# Patient Record
Sex: Male | Born: 1985
Health system: Southern US, Community
[De-identification: ages and names within clinical notes are randomized; demographics above are authoritative.]

## PROBLEM LIST (undated history)

## (undated) DIAGNOSIS — E119 Type 2 diabetes mellitus without complications: Secondary | ICD-10-CM

## (undated) HISTORY — PX: ORTHOPEDIC SURGERY: SHX850

---
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
## 2012-07-28 ENCOUNTER — Emergency Department: Payer: Self-pay | Admitting: Emergency Medicine

## 2012-07-28 LAB — BETA-HYDROXYBUTYRIC ACID: Beta-Hydroxybutyrate: 17.3 mg/dL — ABNORMAL HIGH (ref 0.2–2.8)

## 2012-07-28 LAB — CBC
MCH: 28.3 pg (ref 26.0–34.0)
MCV: 82 fL (ref 80–100)
Platelet: 327 10*3/uL (ref 150–440)

## 2012-07-28 LAB — COMPREHENSIVE METABOLIC PANEL
Alkaline Phosphatase: 135 U/L (ref 50–136)
Anion Gap: 13 (ref 7–16)
Co2: 24 mmol/L (ref 21–32)
EGFR (African American): 32 — ABNORMAL LOW
Glucose: 536 mg/dL (ref 65–99)
Potassium: 6 mmol/L — ABNORMAL HIGH (ref 3.5–5.1)
Sodium: 125 mmol/L — ABNORMAL LOW (ref 136–145)

## 2012-07-28 LAB — CK: CK, Total: 2370 U/L — ABNORMAL HIGH (ref 35–232)

## 2012-07-29 ENCOUNTER — Encounter (HOSPITAL_COMMUNITY): Payer: Self-pay | Admitting: Cardiology

## 2012-07-29 ENCOUNTER — Observation Stay (HOSPITAL_COMMUNITY)
Admission: EM | Admit: 2012-07-29 | Discharge: 2012-07-29 | Disposition: A | Discharge status: Home / Self Care | Payer: 59 | Source: Other Acute Inpatient Hospital | Attending: Internal Medicine | Admitting: Internal Medicine

## 2012-07-29 DIAGNOSIS — Z9641 Presence of insulin pump (external) (internal): Secondary | ICD-10-CM | POA: Insufficient documentation

## 2012-07-29 DIAGNOSIS — E1310 Other specified diabetes mellitus with ketoacidosis without coma: Secondary | ICD-10-CM

## 2012-07-29 DIAGNOSIS — E1110 Type 2 diabetes mellitus with ketoacidosis without coma: Secondary | ICD-10-CM

## 2012-07-29 DIAGNOSIS — Z794 Long term (current) use of insulin: Secondary | ICD-10-CM | POA: Insufficient documentation

## 2012-07-29 DIAGNOSIS — E1010 Type 1 diabetes mellitus with ketoacidosis without coma: Principal | ICD-10-CM | POA: Insufficient documentation

## 2012-07-29 HISTORY — DX: Type 2 diabetes mellitus without complications: E11.9

## 2012-07-29 LAB — BASIC METABOLIC PANEL
BUN: 37 mg/dL — ABNORMAL HIGH (ref 7–18)
BUN: 34 mg/dL — ABNORMAL HIGH (ref 6–23)
BUN: 31 mg/dL — ABNORMAL HIGH (ref 6–23)
CO2: 27 mEq/L (ref 19–32)
CO2: 23 mEq/L (ref 19–32)
Calcium, Total: 8.1 mg/dL — ABNORMAL LOW (ref 8.5–10.1)
Calcium: 8.6 mg/dL (ref 8.4–10.5)
Chloride: 105 mmol/L (ref 98–107)
Chloride: 103 mEq/L (ref 96–112)
Chloride: 98 mEq/L (ref 96–112)
Co2: 28 mmol/L (ref 21–32)
Creatinine, Ser: 1.46 mg/dL — ABNORMAL HIGH (ref 0.50–1.35)
Creatinine, Ser: 1.34 mg/dL (ref 0.50–1.35)
Creatinine: 1.91 mg/dL — ABNORMAL HIGH (ref 0.60–1.30)
EGFR (African American): 55 — ABNORMAL LOW
GFR calc Af Amer: 84 mL/min — ABNORMAL LOW (ref >90)
GFR calc Af Amer: 85 mL/min — ABNORMAL LOW (ref >90)
GFR calc non Af Amer: 65 mL/min — ABNORMAL LOW (ref >90)
GFR calc non Af Amer: 73 mL/min — ABNORMAL LOW (ref >90)
Glucose, Bld: 214 mg/dL — ABNORMAL HIGH (ref 70–99)
Glucose, Bld: 309 mg/dL — ABNORMAL HIGH (ref 70–99)
Glucose: 228 mg/dL — ABNORMAL HIGH (ref 65–99)
Osmolality: 290 (ref 275–301)
Potassium: 4.5 mmol/L (ref 3.5–5.1)
Sodium: 137 mmol/L (ref 136–145)
Sodium: 140 mEq/L (ref 135–145)

## 2012-07-29 LAB — HEMOGLOBIN A1C
Hgb A1c MFr Bld: 8.3 % — ABNORMAL HIGH (ref <5.7)
Mean Plasma Glucose: 192 mg/dL — ABNORMAL HIGH (ref <117)

## 2012-07-29 LAB — GLUCOSE, CAPILLARY
Glucose-Capillary: 101 mg/dL — ABNORMAL HIGH (ref 70–99)
Glucose-Capillary: 175 mg/dL — ABNORMAL HIGH (ref 70–99)

## 2012-07-29 LAB — CBC
HCT: 42.2 % (ref 39.0–52.0)
Hemoglobin: 15.3 g/dL (ref 13.0–17.0)
MCH: 29 pg (ref 26.0–34.0)
MCHC: 36.3 g/dL — ABNORMAL HIGH (ref 30.0–36.0)
MCV: 80.1 fL (ref 78.0–100.0)
RBC: 5.27 MIL/uL (ref 4.22–5.81)
RDW: 12.9 % (ref 11.5–15.5)

## 2012-07-29 MED ORDER — MENTHOL 3 MG MT LOZG
1.0000 | LOZENGE | OROMUCOSAL | Status: DC | PRN
  Filled 2012-07-29: qty 9

## 2012-07-29 MED ORDER — SODIUM CHLORIDE 0.9 % IV SOLN
INTRAVENOUS | Status: DC
  Administered 2012-07-29: 06:00:00 via INTRAVENOUS
  Filled 2012-07-29: qty 1

## 2012-07-29 MED ORDER — DEXTROSE 50 % IV SOLN: 25.0000 mL | INTRAVENOUS | Status: DC | PRN

## 2012-07-29 MED ORDER — DEXTROSE-NACL 5-0.45 % IV SOLN
INTRAVENOUS | Status: DC
  Administered 2012-07-29: 125 mL/h via INTRAVENOUS

## 2012-07-29 MED ORDER — SODIUM CHLORIDE 0.45 % IV SOLN: INTRAVENOUS | Status: DC

## 2012-07-29 MED ORDER — HEPARIN SODIUM (PORCINE) 5000 UNIT/ML IJ SOLN
5000.0000 [IU] | Freq: Three times a day (TID) | INTRAMUSCULAR | Status: DC
  Administered 2012-07-29: 5000 [IU] via SUBCUTANEOUS
  Filled 2012-07-29 (×4): qty 1

## 2012-07-29 NOTE — H&P (Signed)
Name: John Cross MRN: [id number] DOB: [birth year]    LOS: 0  Referring Provider:  [facility] Reason for Referral:  DKA  PULMONARY / CRITICAL CARE MEDICINE  HPI:  Mr. John Cross is a 27 y/o man with type 1 DM diagnosed in 2001.  He has never been hospitalized for DKA before.   He was driving home from [place] this afternoon when he started having muscle cramps.  This is often a sign that his sugar is high.  He tried to drink water to help bring down his sugar but wasn't able to keep anything down.  His parents then took him to the ER where he was found to be in DKA. He was started on IV fluids and an insulin drip and transferred to [facility] for increased level of care.  His nausea is now much improved.  He has no other medical problems.  He has not had any recent signs or symptoms of infection.  No fever, diarrhea, nausea/vomiting (before this afternoon) or cough.   Past Medical History  Diagnosis Date  . Diabetes mellitus without complication    Past Surgical History  Procedure Laterality Date  . Orthopedic surgery      rod placed in right leg   Prior to Admission medications   Not on File   Allergies NKDA  Family History History reviewed. No pertinent family history. Social History  reports that he has never smoked. He does not have any smokeless tobacco history on file. He reports that he does not drink alcohol or use illicit drugs.  Review Of Systems:  A review of 14 systems was negative except as stated in the HPI.   Brief patient description:  27 y/o with DKA  Current Status: guarded Vital Signs: Temp:  [98.7 F (37.1 C)] 98.7 F (37.1 C) (07/14 0315) Pulse Rate:  [96-104] 96 (07/14 0408) Resp:  [16-19] 16 (07/14 0408) BP: (134-143)/(70-104) 138/70 mmHg (07/14 0408) SpO2:  [100 %] 100 % (07/14 0408) Weight:  [109.5 kg (241 lb 6.5 oz)] 109.5 kg (241 lb 6.5 oz) (07/14 0315)  Physical Examination: General:  Well appearing. No distress  Neuro:  CN II-VII  intact, A and O x3 HEENT:  PERRL, EOMI, OP clear Neck:  Supple, no masses Cardiovascular:  NRRR, no mrg Lungs:  CTAB, no wrr Abdomen:  Soft, NTND, +BS Musculoskeletal:  No clubbing, cyanosis or edema Skin:  No rashes, bruises or other lesions.   Active Problems:   * No active hospital problems. *   ASSESSMENT AND PLAN  PULMONARY  A:  Stable on room air P:   Continuous pulse oximetry  CARDIOVASCULAR ECG:  NSR Lines: PIV  A: hemodynamically stable P:  Continuous cardiopulmonary monitoring  RENAL  Intake/Output     07/13 0701 - 07/14 0700   Urine (mL/kg/hr) 550   Total Output 550   Net -550          A: ketoacidosis P:   Monitor urine output q2h BMP until gap closed  GASTROINTESTINAL  A:  Nausea and vomiting P:   Improved Will allow water and sugar free drinks until gap closes    INFECTIOUS Cultures: none Antibiotics: none  A:  No signs or symptoms of infection P:   Monitor for signs and symptoms of infection  ENDOCRINE A:  DKA   P:   Insulin gtt until gap closed Will then start basal/bolus insulin Home management is via insulin pump.  Pt will restart this when d/c'ed. Check HgA1c  BEST PRACTICE / DISPOSITION Level of Care:  ICU Primary Service:  PCCM Consultants:  none Code Status:  full Diet:  Clear sugar free liquids DVT Px:  heparin GI Px:  Not indicated Skin Integrity:  good Social / Family:  Parents involved but not at bedside on transfer  Carolan Clines., M.D. Pulmonary and Critical Care Medicine [facility] Pager: [phone]  I spent 35 minutes of critical care time in the care of this patient seperate from procedures which are documented elsewhere  07/29/2012, 4:14 AM

## 2012-07-29 NOTE — Progress Notes (Signed)
PIV d/c'd catheter tip intact. D/c Instructions given, verbalizes understanding. Will d/c to private vehicle via [facility]

## 2012-07-29 NOTE — Care Management Note (Signed)
    Page 1 of 1   07/29/2012     10:40:34 AM   CARE MANAGEMENT NOTE 07/29/2012  Patient:  John Cross, John Cross   Account Number:  [id number]  Date Initiated:  07/29/2012  Documentation initiated by:  Junius Creamer  Subjective/Objective Assessment:   adm w dka     Action/Plan:   lives w fam   Anticipated DC Date:     Anticipated DC Plan:  HOME/SELF CARE      DC Planning Services  CM consult      Choice offered to / List presented to:             Status of service:   Medicare Important Message given?   (If response is "NO", the following Medicare IM given date fields will be blank) Date Medicare IM given:   Date Additional Medicare IM given:    Discharge Disposition:    Per UR Regulation:  Reviewed for med. necessity/level of care/duration of stay  If discussed at Long Length of Stay Meetings, dates discussed:    Comments:

## 2012-07-29 NOTE — Progress Notes (Signed)
Name: John Cross MRN: [id number] DOB: 1985-08-16    LOS: 0  Referring Provider:  [facility] Reason for Referral:  DKA  PULMONARY / CRITICAL CARE MEDICINE  HPI:  John Cross is a 27 y/o man with type 1 DM diagnosed in 2001.  He has never been hospitalized for DKA before.   He was driving home from [place] this afternoon when he started having muscle cramps.  This is often a sign that his sugar is high.  He tried to drink water to help bring down his sugar but wasn't able to keep anything down.  His parents then took him to the ER where he was found to be in DKA. He was started on IV fluids and an insulin drip and transferred to [facility] for increased level of care.  His nausea is now much improved.  He has no other medical problems.  He has not had any recent signs or symptoms of infection.  No fever, diarrhea, nausea/vomiting (before this afternoon) or cough.   Brief patient description:  27 y/o with DKA  Current Status: Denies chest pain, dyspnea, nausea, abdominal pain.  Vital Signs: Temp:  [98.7 F (37.1 C)-98.9 F (37.2 C)] 98.9 F (37.2 C) (07/14 0823) Pulse Rate:  [88-104] 99 (07/14 0700) Resp:  [14-19] 15 (07/14 0700) BP: (122-143)/(53-104) 125/57 mmHg (07/14 0700) SpO2:  [99 %-100 %] 99 % (07/14 0700) Weight:  [109.5 kg (241 lb 6.5 oz)] 109.5 kg (241 lb 6.5 oz) (07/14 0315) Room air.  Physical Examination: General:  Well appearing. No distress  Neuro:  CN II-VII intact, A and O x3 HEENT:  PERRL, EOMI, OP clear Neck:  Supple, no masses Cardiovascular:  NRRR, no mrg Lungs:  CTAB, no wrr Abdomen:  Soft, NTND, +BS Musculoskeletal:  No clubbing, cyanosis or edema Skin:  No rashes, bruises or other lesions.   Labs: CBC Recent Labs     07/29/12  0509  WBC  13.5*  HGB  15.3  HCT  42.2  PLT  212   BMET Recent Labs     07/29/12  0509  07/29/12  0750  07/29/12  0932  NA  140  135  133*  K  4.2  4.2  4.6  CL  103  100  98  CO2  27  23  22   BUN  34*   31*  31*  CREATININE  1.46*  1.34  1.33  GLUCOSE  93  214*  309*    Electrolytes Recent Labs     07/29/12  0509  07/29/12  0750  07/29/12  0932  CALCIUM  8.9  8.7  8.6   Glucose Recent Labs     07/29/12  0419  07/29/12  0527  07/29/12  0624  07/29/12  0731  07/29/12  0825  GLUCAP  101*  100*  130*  175*  234*    ASSESSMENT AND PLAN  A: DKA > resolved. DM type I. P: -resume insulin pump -advance diet  A: Nausea and vomiting 2nd to DKA >> resolved. P:   -monitor while advancing diet  Today's Summary: Anion gap has now closed. John Cross is now back on his home insulin infusion pump. Can resume diet. Can discharge home if stable after eating.  [facility] S-ACNP  Reviewed above, examined pt, and agree with assessment/plan.  He was transferred to [facility] with DKA due to insulin pump malfunction >> this has resolved.  If he is able to tolerate diet, then plan  for d/c home later today.  He is followed by Dr. Adrian Prince for endocrinology and will f/u with Dr. Evlyn Kanner.  Updated father at bedside.  Coralyn Helling, MD [facility] Pulmonary/Critical Care 07/29/2012, 11:03 AM Pager:  [phone] After 3pm call: [phone]

## 2012-07-29 NOTE — Discharge Summary (Signed)
Physician Discharge Summary     Patient ID: John Cross MRN: [id number] DOB/AGE: June 20, 1985 27 y.o.  Admit date: 07/29/2012 Discharge date: 07/29/2012  Discharge Diagnoses:   Diabetic Ketacidosis   Detailed Hospital Course:  John Cross is a 27 y/o man with type 1 DM diagnosed in 2001. He has never been hospitalized for DKA before. He was driving home from [place] this afternoon when he started having muscle cramps. This is often a sign that his sugar is high. He tried to drink water to help bring down his sugar but wasn't able to keep anything down. His parents then took him to the ER where he was found to be in DKA. He was started on IV fluids and an insulin drip and transferred to [facility] for increased level of care. His nausea is now much improved. He has no other medical problems. He has not had any recent signs or symptoms of infection. No fever, diarrhea, nausea/vomiting (before this afternoon) or cough.  At time of arrival Anion gap was closed. Transitioned to insulin pump. Tolerating diet. Clear for discharge  Discharge Plan by diagnoses   DKA:  - Resume insulin pump at home settings - CBG per home schedule - Follow up with Endocrinologist or PCP as needed  Discharge Exam: BP 126/56  Pulse 93  Temp(Src) 98.9 F (37.2 C) (Oral)  Resp 15  Ht 6\' 4"  (1.93 m)  Wt 109.5 kg (241 lb 6.5 oz)  BMI 29.4 kg/m2  SpO2 100%  Physical Exam: General: Well appearing. No distress  Neuro: CN II-VII intact, A and O x3  HEENT: PERRL, EOMI, OP clear  Neck: Supple, no masses  Cardiovascular: NRRR, no mrg  Lungs: CTAB, no wrr  Abdomen: Soft, NTND, +BS  Musculoskeletal: No clubbing, cyanosis or edema  Skin: No rashes, bruises or other lesions.    Labs at discharge Lab Results  Component Value Date   CREATININE 1.33 07/29/2012   BUN 31* 07/29/2012   NA 133* 07/29/2012   K 4.6 07/29/2012   CL 98 07/29/2012   CO2 22 07/29/2012   Lab Results  Component Value Date   WBC  13.5* 07/29/2012   HGB 15.3 07/29/2012   HCT 42.2 07/29/2012   MCV 80.1 07/29/2012   PLT 212 07/29/2012    Disposition: Home       Discharge Orders   Future Orders Complete By Expires     Diet - low sodium heart healthy  As directed     Discharge instructions  As directed     Comments:      Resume your typical insulin Pump regimen    Increase activity slowly  As directed         Medication List         meloxicam 15 MG tablet  Commonly known as:  MOBIC  Take 15 mg by mouth daily as needed for pain.     SYSTANE OP  Place 2 drops into both eyes daily as needed (dry eyes).       Follow-up Information   Follow up with Julian Hy, MD In 1 week.   Contact information:   [address] [place] [address] [phone]       Discharged Condition: good  Physician Statement:   The Patient was personally examined, the discharge assessment and plan has been personally reviewed and I agree with ACNP Babcock's assessment and plan. > 30 minutes of time have been dedicated to discharge assessment, planning and discharge instructions.  Signed:  Coralyn Helling, MD [facility] Pulmonary/Critical Care 07/29/2012, 1:35 PM Pager:  [phone] After 3pm call: [phone]

## 2012-07-30 LAB — GLUCOSE, CAPILLARY: Glucose-Capillary: 306 mg/dL — ABNORMAL HIGH (ref 70–99)

## 2013-05-17 ENCOUNTER — Emergency Department (HOSPITAL_COMMUNITY)
Admission: EM | Admit: 2013-05-17 | Discharge: 2013-05-18 | Disposition: A | Discharge status: Home / Self Care | Payer: BC Managed Care – PPO | Attending: Emergency Medicine | Admitting: Emergency Medicine

## 2013-05-17 ENCOUNTER — Encounter (HOSPITAL_COMMUNITY): Payer: Self-pay | Admitting: Emergency Medicine

## 2013-05-17 DIAGNOSIS — Y9364 Activity, baseball: Secondary | ICD-10-CM | POA: Insufficient documentation

## 2013-05-17 DIAGNOSIS — Y92838 Other recreation area as the place of occurrence of the external cause: Secondary | ICD-10-CM

## 2013-05-17 DIAGNOSIS — Y9239 Other specified sports and athletic area as the place of occurrence of the external cause: Secondary | ICD-10-CM | POA: Insufficient documentation

## 2013-05-17 DIAGNOSIS — S61419A Laceration without foreign body of unspecified hand, initial encounter: Secondary | ICD-10-CM

## 2013-05-17 DIAGNOSIS — Z23 Encounter for immunization: Secondary | ICD-10-CM | POA: Insufficient documentation

## 2013-05-17 DIAGNOSIS — Z791 Long term (current) use of non-steroidal anti-inflammatories (NSAID): Secondary | ICD-10-CM | POA: Insufficient documentation

## 2013-05-17 DIAGNOSIS — W219XXA Striking against or struck by unspecified sports equipment, initial encounter: Secondary | ICD-10-CM | POA: Insufficient documentation

## 2013-05-17 DIAGNOSIS — S61409A Unspecified open wound of unspecified hand, initial encounter: Secondary | ICD-10-CM | POA: Insufficient documentation

## 2013-05-17 DIAGNOSIS — E119 Type 2 diabetes mellitus without complications: Secondary | ICD-10-CM | POA: Insufficient documentation

## 2013-05-17 MED ORDER — LIDOCAINE HCL (PF) 1 % IJ SOLN
5.0000 mL | Freq: Once | INTRAMUSCULAR | Status: AC
  Administered 2013-05-18: 5 mL via INTRADERMAL
  Filled 2013-05-17: qty 5

## 2013-05-17 MED ORDER — TETANUS-DIPHTH-ACELL PERTUSSIS 5-2.5-18.5 LF-MCG/0.5 IM SUSP
0.5000 mL | Freq: Once | INTRAMUSCULAR | Status: AC
  Administered 2013-05-18: 0.5 mL via INTRAMUSCULAR
  Filled 2013-05-17: qty 0.5

## 2013-05-17 NOTE — ED Notes (Signed)
Pt has a laceration between right index and thumb.  Bleeding controlled

## 2013-05-18 MED ORDER — BACITRACIN 500 UNIT/GM EX OINT
1.0000 "application " | TOPICAL_OINTMENT | Freq: Two times a day (BID) | CUTANEOUS | Status: DC
  Filled 2013-05-18 (×4): qty 0.9

## 2013-05-18 MED ORDER — HYDROCODONE-ACETAMINOPHEN 5-325 MG PO TABS: ORAL_TABLET | ORAL | Status: AC

## 2013-05-18 MED ORDER — BACITRACIN ZINC 500 UNIT/GM EX OINT
TOPICAL_OINTMENT | CUTANEOUS | Status: AC
  Filled 2013-05-18: qty 1.8

## 2013-05-18 NOTE — ED Provider Notes (Signed)
Medical screening examination/treatment/procedure(s) were performed by non-physician practitioner and as supervising physician I was immediately available for consultation/collaboration.   EKG Interpretation None        Loren Raceravid Charlean Carneal, MD 05/18/13 [phone]

## 2013-05-18 NOTE — Discharge Instructions (Signed)

## 2013-05-18 NOTE — ED Provider Notes (Signed)
CSN: [id number]     Arrival date & time 05/17/13  2329 History   First MD Initiated Contact with Patient 05/17/13 2347     Chief Complaint  Patient presents with  . Hand Injury     (Consider location/radiation/quality/duration/timing/severity/associated sxs/prior Treatment) Patient is a 28 y.o. male presenting with skin laceration. The history is provided by the patient.  Laceration Location:  Hand Hand laceration location: first web space of the right hand. Length (cm):  3 Depth:  Through dermis Quality: straight   Bleeding: controlled   Time since incident:  5 hours Injury mechanism: pt states laceration occurred when his hand was struck playing softball.  reports a tearing injury of the skin. Pain details:    Quality:  Dull   Severity:  Mild   Timing:  Intermittent   Progression:  Improving Foreign body present:  No foreign bodies Relieved by:  Certain positions Worsened by:  Movement Ineffective treatments:  None tried Tetanus status:  Out of date   Patient states that the laceration occurred when he tried to catch a softball and the ball bounced and struck his hand causing a "skin tear" to the right hand.  Pt states  That he finished to game and pain has been minimal.  He denies pain with movement, numbness, weakness of the fingers, wrist pain or persistent bleeding.    Past Medical History  Diagnosis Date  . Diabetes mellitus without complication    Past Surgical History  Procedure Laterality Date  . Orthopedic surgery      rod placed in right leg   History reviewed. No pertinent family history. History  Substance Use Topics  . Smoking status: Never Smoker   . Smokeless tobacco: Not on file  . Alcohol Use: No    Review of Systems  Constitutional: Negative for fever and chills.  Musculoskeletal: Negative for arthralgias, back pain and joint swelling.  Skin: Positive for wound.       Laceration   Neurological: Negative for dizziness, weakness and numbness.    Hematological: Does not bruise/bleed easily.  All other systems reviewed and are negative.     Allergies  Review of patient's allergies indicates no known allergies.  Home Medications   Prior to Admission medications   Medication Sig Start Date End Date Taking? Authorizing Provider  meloxicam (MOBIC) 15 MG tablet Take 15 mg by mouth daily as needed for pain.    Historical Provider, MD  Polyethyl Glycol-Propyl Glycol (SYSTANE OP) Place 2 drops into both eyes daily as needed (dry eyes).    Historical Provider, MD   BP 137/90  Pulse 84  Temp(Src) 97.8 F (36.6 C) (Oral)  Resp 18  Ht 6\' 4"  (1.93 m)  Wt 245 lb (111.131 kg)  BMI 29.83 kg/m2  SpO2 100% Physical Exam  Nursing note and vitals reviewed. Constitutional: He is oriented to person, place, and time. He appears well-developed and well-nourished. No distress.  HENT:  Head: Normocephalic and atraumatic.  Cardiovascular: Normal rate, regular rhythm, normal heart sounds and intact distal pulses.   No murmur heard. Pulmonary/Chest: Effort normal and breath sounds normal. No respiratory distress.  Musculoskeletal: Normal range of motion. He exhibits no edema.       Right hand: He exhibits laceration. He exhibits normal range of motion, no tenderness, no bony tenderness, normal two-point discrimination, normal capillary refill, no deformity and no swelling. Normal sensation noted. Normal strength noted. He exhibits no finger abduction and no thumb/finger opposition.  Hands: 3 cm Laceration to the web space of the right hand.  Bleeding controlled , no edema.  Radial pulse brisk, distal sensation intact.  CR< 2 sec.  Pt has nml finger/ thumb opposition.    Neurological: He is alert and oriented to person, place, and time. He exhibits normal muscle tone. Coordination normal.    ED Course  Procedures (including critical care time) Labs Review Labs Reviewed - No data to display  Imaging Review No results found.   EKG  Interpretation None      LACERATION REPAIR Performed by: Roselynn Whitacre L. Agostino Gorin Authorized by: Lebert Lovern L. Evalene Vath Consent: Verbal consent obtained. Risks and benefits: risks, benefits and alternatives were discussed Consent given by: patient Patient identity confirmed: provided demographic data Prepped and Draped in normal sterile fashion Wound explored  Laceration Location: first web space of right hand Laceration Length: 3 cm  No Foreign Bodies seen or palpated  Anesthesia: local infiltration  Local anesthetic: lidocaine 1 % w/o epinephrine  Anesthetic total: 3 ml  Irrigation method: syringe Amount of cleaning: standard  Skin closure: 4-0 prolene  Number of sutures: 6  Technique: simple interrrupted  Patient tolerance: Patient tolerated the procedure well with no immediate complications.   MDM   Final diagnoses:  Laceration of hand    Pt is well appearing, Td updated tonight.  Wound edges are well approximated, bleeding controlled, NV intact.  Advised to clean wound with soap and water and keep bandaged.  Sutures out in 10 days.  He agrees to return here for any signs of infection  VSS.  Pt appears stable for d/c and agrees to care plan.  # 6 vicodin dispensed for pain   Makenzy Krist L. Mabelle Mungin, PA-C 05/18/13 0121

## 2013-05-19 MED FILL — Hydrocodone-Acetaminophen Tab 5-325 MG: ORAL | Qty: 6 | Status: AC

## 2014-09-22 ENCOUNTER — Other Ambulatory Visit: Payer: Self-pay | Admitting: Occupational Medicine

## 2014-09-22 ENCOUNTER — Ambulatory Visit
Admission: RE | Admit: 2014-09-22 | Discharge: 2014-09-22 | Disposition: A | Discharge status: Home / Self Care | Payer: No Typology Code available for payment source | Source: Ambulatory Visit | Attending: Occupational Medicine | Admitting: Occupational Medicine

## 2014-09-22 DIAGNOSIS — Z021 Encounter for pre-employment examination: Secondary | ICD-10-CM

## 2014-10-14 ENCOUNTER — Other Ambulatory Visit (HOSPITAL_COMMUNITY): Payer: Self-pay | Admitting: Endocrinology

## 2014-10-14 ENCOUNTER — Telehealth (HOSPITAL_COMMUNITY): Payer: Self-pay

## 2014-10-14 DIAGNOSIS — Z0289 Encounter for other administrative examinations: Secondary | ICD-10-CM

## 2014-10-14 NOTE — Telephone Encounter (Signed)
Encounter complete. 

## 2014-10-16 ENCOUNTER — Ambulatory Visit (HOSPITAL_COMMUNITY)
Admission: RE | Admit: 2014-10-16 | Discharge: 2014-10-16 | Disposition: A | Discharge status: Home / Self Care | Payer: Self-pay | Source: Ambulatory Visit | Attending: Cardiology | Admitting: Cardiology

## 2014-10-16 DIAGNOSIS — Z0289 Encounter for other administrative examinations: Secondary | ICD-10-CM | POA: Insufficient documentation

## 2014-10-16 LAB — EXERCISE TOLERANCE TEST
CHL CUP MPHR: 193 {beats}/min
CSEPEW: 17.2 METS
CSEPHR: 98 %
CSEPPHR: 190 {beats}/min
Exercise duration (min): 13 min
Exercise duration (sec): 56 s
RPE: 16
Rest HR: 79 {beats}/min

## 2015-11-03 ENCOUNTER — Encounter (INDEPENDENT_AMBULATORY_CARE_PROVIDER_SITE_OTHER): Payer: 59 | Admitting: Ophthalmology

## 2015-11-03 DIAGNOSIS — H33301 Unspecified retinal break, right eye: Secondary | ICD-10-CM | POA: Diagnosis not present

## 2015-11-03 DIAGNOSIS — H5213 Myopia, bilateral: Secondary | ICD-10-CM | POA: Diagnosis not present

## 2015-11-03 DIAGNOSIS — H33022 Retinal detachment with multiple breaks, left eye: Secondary | ICD-10-CM | POA: Diagnosis not present

## 2015-11-03 DIAGNOSIS — H43813 Vitreous degeneration, bilateral: Secondary | ICD-10-CM

## 2015-11-10 ENCOUNTER — Ambulatory Visit (INDEPENDENT_AMBULATORY_CARE_PROVIDER_SITE_OTHER): Payer: 59 | Admitting: Ophthalmology

## 2015-11-18 ENCOUNTER — Ambulatory Visit (INDEPENDENT_AMBULATORY_CARE_PROVIDER_SITE_OTHER): Payer: 59 | Admitting: Ophthalmology

## 2015-11-18 DIAGNOSIS — H33303 Unspecified retinal break, bilateral: Secondary | ICD-10-CM

## 2015-12-03 ENCOUNTER — Encounter (INDEPENDENT_AMBULATORY_CARE_PROVIDER_SITE_OTHER): Payer: 59 | Admitting: Ophthalmology

## 2015-12-03 DIAGNOSIS — H33301 Unspecified retinal break, right eye: Secondary | ICD-10-CM

## 2016-04-03 ENCOUNTER — Ambulatory Visit (INDEPENDENT_AMBULATORY_CARE_PROVIDER_SITE_OTHER): Payer: 59 | Admitting: Ophthalmology

## 2016-04-20 DIAGNOSIS — E1065 Type 1 diabetes mellitus with hyperglycemia: Secondary | ICD-10-CM | POA: Diagnosis not present

## 2016-05-10 DIAGNOSIS — L723 Sebaceous cyst: Secondary | ICD-10-CM | POA: Diagnosis not present

## 2016-06-19 DIAGNOSIS — E1065 Type 1 diabetes mellitus with hyperglycemia: Secondary | ICD-10-CM | POA: Diagnosis not present

## 2016-08-18 DIAGNOSIS — S233XXA Sprain of ligaments of thoracic spine, initial encounter: Secondary | ICD-10-CM | POA: Diagnosis not present

## 2016-08-18 DIAGNOSIS — S134XXA Sprain of ligaments of cervical spine, initial encounter: Secondary | ICD-10-CM | POA: Diagnosis not present

## 2016-08-18 DIAGNOSIS — S338XXA Sprain of other parts of lumbar spine and pelvis, initial encounter: Secondary | ICD-10-CM | POA: Diagnosis not present

## 2016-09-13 DIAGNOSIS — Z23 Encounter for immunization: Secondary | ICD-10-CM | POA: Diagnosis not present

## 2016-09-13 DIAGNOSIS — E1065 Type 1 diabetes mellitus with hyperglycemia: Secondary | ICD-10-CM | POA: Diagnosis not present

## 2016-09-13 DIAGNOSIS — G43909 Migraine, unspecified, not intractable, without status migrainosus: Secondary | ICD-10-CM | POA: Diagnosis not present

## 2016-09-13 DIAGNOSIS — G2581 Restless legs syndrome: Secondary | ICD-10-CM | POA: Diagnosis not present

## 2016-10-23 DIAGNOSIS — E1065 Type 1 diabetes mellitus with hyperglycemia: Secondary | ICD-10-CM | POA: Diagnosis not present

## 2017-01-20 DIAGNOSIS — E119 Type 2 diabetes mellitus without complications: Secondary | ICD-10-CM | POA: Insufficient documentation

## 2017-01-20 DIAGNOSIS — N390 Urinary tract infection, site not specified: Secondary | ICD-10-CM | POA: Diagnosis not present

## 2017-01-20 DIAGNOSIS — Z841 Family history of disorders of kidney and ureter: Secondary | ICD-10-CM | POA: Diagnosis not present

## 2017-01-21 DIAGNOSIS — N390 Urinary tract infection, site not specified: Secondary | ICD-10-CM | POA: Diagnosis not present

## 2017-04-14 DIAGNOSIS — L02419 Cutaneous abscess of limb, unspecified: Secondary | ICD-10-CM | POA: Diagnosis not present

## 2017-05-09 ENCOUNTER — Emergency Department (HOSPITAL_COMMUNITY): Payer: 59

## 2017-05-09 ENCOUNTER — Encounter (HOSPITAL_COMMUNITY): Payer: Self-pay | Admitting: Emergency Medicine

## 2017-05-09 ENCOUNTER — Other Ambulatory Visit: Payer: Self-pay

## 2017-05-09 ENCOUNTER — Emergency Department (HOSPITAL_COMMUNITY)
Admission: EM | Admit: 2017-05-09 | Discharge: 2017-05-09 | Disposition: A | Discharge status: Home / Self Care | Payer: 59 | Attending: Emergency Medicine | Admitting: Emergency Medicine

## 2017-05-09 DIAGNOSIS — Z794 Long term (current) use of insulin: Secondary | ICD-10-CM | POA: Insufficient documentation

## 2017-05-09 DIAGNOSIS — E1110 Type 2 diabetes mellitus with ketoacidosis without coma: Secondary | ICD-10-CM | POA: Diagnosis not present

## 2017-05-09 DIAGNOSIS — R1110 Vomiting, unspecified: Secondary | ICD-10-CM | POA: Diagnosis not present

## 2017-05-09 DIAGNOSIS — E860 Dehydration: Secondary | ICD-10-CM | POA: Insufficient documentation

## 2017-05-09 DIAGNOSIS — T675XXA Heat exhaustion, unspecified, initial encounter: Secondary | ICD-10-CM | POA: Diagnosis not present

## 2017-05-09 DIAGNOSIS — R4020 Unspecified coma: Secondary | ICD-10-CM | POA: Diagnosis not present

## 2017-05-09 LAB — COMPREHENSIVE METABOLIC PANEL
ALT: 35 U/L (ref 17–63)
ANION GAP: 12 (ref 5–15)
AST: 28 U/L (ref 15–41)
Albumin: 5 g/dL (ref 3.5–5.0)
Alkaline Phosphatase: 79 U/L (ref 38–126)
BILIRUBIN TOTAL: 1.4 mg/dL — AB (ref 0.3–1.2)
BUN: 23 mg/dL — ABNORMAL HIGH (ref 6–20)
CHLORIDE: 99 mmol/L — AB (ref 101–111)
CO2: 26 mmol/L (ref 22–32)
Calcium: 9.3 mg/dL (ref 8.9–10.3)
Creatinine, Ser: 1.07 mg/dL (ref 0.61–1.24)
GFR calc non Af Amer: >60 mL/min (ref >60)
Glucose, Bld: 160 mg/dL — ABNORMAL HIGH (ref 65–99)
POTASSIUM: 4 mmol/L (ref 3.5–5.1)
Sodium: 137 mmol/L (ref 135–145)
TOTAL PROTEIN: 7.8 g/dL (ref 6.5–8.1)

## 2017-05-09 LAB — DIFFERENTIAL
BASOS PCT: 0 %
Basophils Absolute: 0 10*3/uL (ref 0.0–0.1)
Eosinophils Absolute: 0.2 10*3/uL (ref 0.0–0.7)
Eosinophils Relative: 2 %
LYMPHS PCT: 15 %
Lymphs Abs: 1.3 10*3/uL (ref 0.7–4.0)
MONOS PCT: 5 %
Monocytes Absolute: 0.5 10*3/uL (ref 0.1–1.0)
NEUTROS ABS: 6.9 10*3/uL (ref 1.7–7.7)
Neutrophils Relative %: 78 %

## 2017-05-09 LAB — CBC
HEMATOCRIT: 47.2 % (ref 39.0–52.0)
Hemoglobin: 16.4 g/dL (ref 13.0–17.0)
MCH: 28.5 pg (ref 26.0–34.0)
MCHC: 34.7 g/dL (ref 30.0–36.0)
MCV: 82.1 fL (ref 78.0–100.0)
Platelets: 260 10*3/uL (ref 150–400)
RBC: 5.75 MIL/uL (ref 4.22–5.81)
RDW: 13 % (ref 11.5–15.5)
WBC: 8.9 10*3/uL (ref 4.0–10.5)

## 2017-05-09 LAB — CBG MONITORING, ED
GLUCOSE-CAPILLARY: 127 mg/dL — AB (ref 65–99)
Glucose-Capillary: 196 mg/dL — ABNORMAL HIGH (ref 65–99)
Glucose-Capillary: 186 mg/dL — ABNORMAL HIGH (ref 65–99)

## 2017-05-09 LAB — LIPASE, BLOOD: LIPASE: 18 U/L (ref 11–51)

## 2017-05-09 MED ORDER — SODIUM CHLORIDE 0.9 % IV BOLUS
2000.0000 mL | Freq: Once | INTRAVENOUS | Status: AC
  Administered 2017-05-09: 2000 mL via INTRAVENOUS

## 2017-05-09 MED ORDER — ONDANSETRON 4 MG PO TBDP: ORAL_TABLET | ORAL | 0 refills | Status: AC

## 2017-05-09 MED ORDER — SODIUM CHLORIDE 0.9 % IV BOLUS
1000.0000 mL | Freq: Once | INTRAVENOUS | Status: AC
  Administered 2017-05-09: 1000 mL via INTRAVENOUS

## 2017-05-09 MED ORDER — TRAMADOL HCL 50 MG PO TABS: 50.0000 mg | ORAL_TABLET | Freq: Four times a day (QID) | ORAL | 0 refills | Status: AC | PRN

## 2017-05-09 MED ORDER — PROCHLORPERAZINE EDISYLATE 10 MG/2ML IJ SOLN
10.0000 mg | Freq: Once | INTRAMUSCULAR | Status: AC
  Administered 2017-05-09: 10 mg via INTRAVENOUS
  Filled 2017-05-09: qty 2

## 2017-05-09 MED ORDER — HYDROMORPHONE HCL 1 MG/ML IJ SOLN
1.0000 mg | Freq: Once | INTRAMUSCULAR | Status: AC
  Administered 2017-05-09: 1 mg via INTRAVENOUS
  Filled 2017-05-09: qty 1

## 2017-05-09 MED ORDER — KETOROLAC TROMETHAMINE 30 MG/ML IJ SOLN
30.0000 mg | Freq: Once | INTRAMUSCULAR | Status: AC
  Administered 2017-05-09: 30 mg via INTRAVENOUS
  Filled 2017-05-09: qty 1

## 2017-05-09 MED ORDER — FAMOTIDINE IN NACL 20-0.9 MG/50ML-% IV SOLN
20.0000 mg | Freq: Once | INTRAVENOUS | Status: AC
  Administered 2017-05-09: 20 mg via INTRAVENOUS
  Filled 2017-05-09: qty 50

## 2017-05-09 MED ORDER — ONDANSETRON HCL 4 MG/2ML IJ SOLN
4.0000 mg | Freq: Once | INTRAMUSCULAR | Status: AC
  Administered 2017-05-09: 4 mg via INTRAVENOUS
  Filled 2017-05-09: qty 2

## 2017-05-09 NOTE — Discharge Instructions (Addendum)
Drink plenty of fluids.  Follow-up with your family doctor for recheck. Next week.  Return to the emergency department for any problems

## 2017-05-09 NOTE — ED Triage Notes (Addendum)
Pt's wife states that pt has been vomiting, intermittent confusion and sweating since 11 am. Pt was throwing things about in the kitchen, calling wife mama. CGB 127. Denies any new injury or hitting head

## 2017-05-09 NOTE — ED Notes (Signed)
Pt returned from CT °

## 2017-05-09 NOTE — ED Notes (Signed)
Pt transported to CT ?

## 2017-05-11 NOTE — ED Provider Notes (Signed)
[facility] EMERGENCY DEPARTMENT Provider Note   CSN: [id number] Arrival date & time: 05/09/17  1253     History   Chief Complaint Chief Complaint  Patient presents with  . Emesis    HPI John Cross is a 32 y.o. male.  Patient with vomiting today previously.  Yesterday he was outside working in the hot sun all day.  Patient has a history of diabetes.  The history is provided by the patient and a caregiver. No language interpreter was used.  Illness  This is a new problem. The current episode started 12 to 24 hours ago. The problem occurs constantly. The problem has not changed since onset.Pertinent negatives include no chest pain, no abdominal pain and no headaches. Nothing aggravates the symptoms. Nothing relieves the symptoms. He has tried nothing for the symptoms. The treatment provided no relief.    Past Medical History:  Diagnosis Date  . Diabetes mellitus without complication [facility])     Patient Active Problem List   Diagnosis Date Noted  . DKA (diabetic ketoacidoses) (HCC) 07/29/2012    Past Surgical History:  Procedure Laterality Date  . ORTHOPEDIC SURGERY     rod placed in right leg        Home Medications    Prior to Admission medications   Medication Sig Start Date End Date Taking? Authorizing Provider  HUMALOG 100 UNIT/ML injection INJECT UP TO 70 UNITS DAILY PER INSULIN PUMP 04/10/17  Yes [provider]  HYDROcodone-acetaminophen (NORCO/VICODIN) 5-325 MG per tablet Take one-two tabs po q 4-6 hrs prn pain Patient not taking: Reported on 05/09/2017 05/18/13   Triplett, Tammy, PA-C  ondansetron (ZOFRAN ODT) 4 MG disintegrating tablet 4mg  ODT q4 hours prn nausea/vomit 05/09/17   Bethann Berkshire, MD  traMADol (ULTRAM) 50 MG tablet Take 1 tablet (50 mg total) by mouth every 6 (six) hours as needed. 05/09/17   Bethann Berkshire, MD    Family History History reviewed. No pertinent family history.  Social History Social History   Tobacco Use  .  Smoking status: Never Smoker  . Smokeless tobacco: Never Used  Substance Use Topics  . Alcohol use: No  . Drug use: No     Allergies   Patient has no known allergies.   Review of Systems Review of Systems  Constitutional: Negative for appetite change and fatigue.  HENT: Negative for congestion, ear discharge and sinus pressure.   Eyes: Negative for discharge.  Respiratory: Negative for cough.   Cardiovascular: Negative for chest pain.  Gastrointestinal: Positive for vomiting. Negative for abdominal pain and diarrhea.  Genitourinary: Negative for frequency and hematuria.  Musculoskeletal: Negative for back pain.  Skin: Negative for rash.  Neurological: Negative for seizures and headaches.  Psychiatric/Behavioral: Negative for hallucinations.     Physical Exam Updated Vital Signs BP (!) 111/55   Pulse 80   Temp 97.9 F (36.6 C) (Oral)   Resp 11   Ht 6' (1.829 m)   Wt 117.9 kg (260 lb)   SpO2 100%   BMI 35.26 kg/m   Physical Exam  Constitutional: He is oriented to person, place, and time. He appears well-developed.  Dry mucous membrane  HENT:  Head: Normocephalic.  Eyes: Conjunctivae and EOM are normal. No scleral icterus.  Neck: Neck supple. No thyromegaly present.  Cardiovascular: Normal rate and regular rhythm. Exam reveals no gallop and no friction rub.  No murmur heard. Pulmonary/Chest: No stridor. He has no wheezes. He has no rales. He exhibits no tenderness.  Abdominal:  He exhibits no distension. There is no tenderness. There is no rebound.  Musculoskeletal: Normal range of motion. He exhibits no edema.  Lymphadenopathy:    He has no cervical adenopathy.  Neurological: He is oriented to person, place, and time. He exhibits normal muscle tone. Coordination normal.  Skin: No rash noted. No erythema.  Psychiatric: He has a normal mood and affect. His behavior is normal.     ED Treatments / Results  Labs (all labs ordered are listed, but only abnormal  results are displayed) Labs Reviewed  COMPREHENSIVE METABOLIC PANEL - Abnormal; Notable for the following components:      Result Value   Chloride 99 (*)    Glucose, Bld 160 (*)    BUN 23 (*)    Total Bilirubin 1.4 (*)    All other components within normal limits  CBG MONITORING, ED - Abnormal; Notable for the following components:   Glucose-Capillary 127 (*)    All other components within normal limits  CBG MONITORING, ED - Abnormal; Notable for the following components:   Glucose-Capillary 196 (*)    All other components within normal limits  CBG MONITORING, ED - Abnormal; Notable for the following components:   Glucose-Capillary 186 (*)    All other components within normal limits  LIPASE, BLOOD  CBC  DIFFERENTIAL    EKG None  Radiology No results found.  Procedures Procedures (including critical care time)  Medications Ordered in ED Medications  sodium chloride 0.9 % bolus 2,000 mL (0 mLs Intravenous Stopped 05/09/17 1600)  ondansetron (ZOFRAN) injection 4 mg (4 mg Intravenous Given 05/09/17 1337)  famotidine (PEPCID) IVPB 20 mg premix (0 mg Intravenous Stopped 05/09/17 1447)  ketorolac (TORADOL) 30 MG/ML injection 30 mg (30 mg Intravenous Given 05/09/17 1339)  HYDROmorphone (DILAUDID) injection 1 mg (1 mg Intravenous Given 05/09/17 1455)  sodium chloride 0.9 % bolus 1,000 mL (0 mLs Intravenous Stopped 05/09/17 1725)  prochlorperazine (COMPAZINE) injection 10 mg (10 mg Intravenous Given 05/09/17 1620)     Initial Impression / Assessment and Plan / ED Course  I have reviewed the triage vital signs and the nursing notes.  Pertinent labs & imaging results that were available during my care of the patient were reviewed by me and considered in my medical decision making (see chart for details).     Patient with persistent vomiting.  Patient improved with fluids and nausea medicine.  Suspect patient had some heat exhaustion.  Labs show mildly elevated sugar.  Patient will be  discharged home with nausea medicine will follow-up if needed  Final Clinical Impressions(s) / ED Diagnoses   Final diagnoses:  Dehydration  Heat exhaustion, initial encounter    ED Discharge Orders        Ordered    traMADol (ULTRAM) 50 MG tablet  Every 6 hours PRN     05/09/17 1707    ondansetron (ZOFRAN ODT) 4 MG disintegrating tablet     05/09/17 1707       Bethann BerkshireZammit, Nichols Corter, MD 05/11/17 2256

## 2017-06-19 DIAGNOSIS — E048 Other specified nontoxic goiter: Secondary | ICD-10-CM | POA: Diagnosis not present

## 2017-06-19 DIAGNOSIS — Z1389 Encounter for screening for other disorder: Secondary | ICD-10-CM | POA: Diagnosis not present

## 2017-06-19 DIAGNOSIS — E1065 Type 1 diabetes mellitus with hyperglycemia: Secondary | ICD-10-CM | POA: Diagnosis not present

## 2017-06-19 DIAGNOSIS — G2581 Restless legs syndrome: Secondary | ICD-10-CM | POA: Diagnosis not present

## 2017-06-19 DIAGNOSIS — G43909 Migraine, unspecified, not intractable, without status migrainosus: Secondary | ICD-10-CM | POA: Diagnosis not present

## 2017-06-25 DIAGNOSIS — E1065 Type 1 diabetes mellitus with hyperglycemia: Secondary | ICD-10-CM | POA: Diagnosis not present

## 2017-09-14 DIAGNOSIS — E109 Type 1 diabetes mellitus without complications: Secondary | ICD-10-CM | POA: Diagnosis not present

## 2017-09-21 DIAGNOSIS — K5900 Constipation, unspecified: Secondary | ICD-10-CM | POA: Diagnosis not present

## 2017-09-26 DIAGNOSIS — E1065 Type 1 diabetes mellitus with hyperglycemia: Secondary | ICD-10-CM | POA: Diagnosis not present

## 2017-11-14 DIAGNOSIS — R740 Nonspecific elevation of levels of transaminase and lactic acid dehydrogenase [LDH]: Secondary | ICD-10-CM | POA: Diagnosis not present

## 2017-11-14 DIAGNOSIS — E109 Type 1 diabetes mellitus without complications: Secondary | ICD-10-CM | POA: Diagnosis not present

## 2017-11-14 DIAGNOSIS — E1065 Type 1 diabetes mellitus with hyperglycemia: Secondary | ICD-10-CM | POA: Diagnosis not present

## 2017-12-28 DIAGNOSIS — E1065 Type 1 diabetes mellitus with hyperglycemia: Secondary | ICD-10-CM | POA: Diagnosis not present

## 2018-03-26 DIAGNOSIS — E1065 Type 1 diabetes mellitus with hyperglycemia: Secondary | ICD-10-CM | POA: Diagnosis not present

## 2018-03-26 DIAGNOSIS — Z794 Long term (current) use of insulin: Secondary | ICD-10-CM | POA: Diagnosis not present

## 2018-03-28 DIAGNOSIS — E1065 Type 1 diabetes mellitus with hyperglycemia: Secondary | ICD-10-CM | POA: Diagnosis not present

## 2018-09-16 IMAGING — CT CT HEAD W/O CM
3 series · 16 of 47 positions shown, 19 images · non-contrast
Comparison: None.

CLINICAL DATA: Episode of altered level of consciousness today in a
diabetic patient. Initial encounter.

EXAM:
CT HEAD WITHOUT CONTRAST
TECHNIQUE: Contiguous axial images were obtained from the base of the skull
through the vertex without intravenous contrast.

[Series 2: head wo · axial · 0.46mm/px · z∈[+220,+355]mm · 10 of 33 slices shown, 13 images]
[im 3/33  brain]
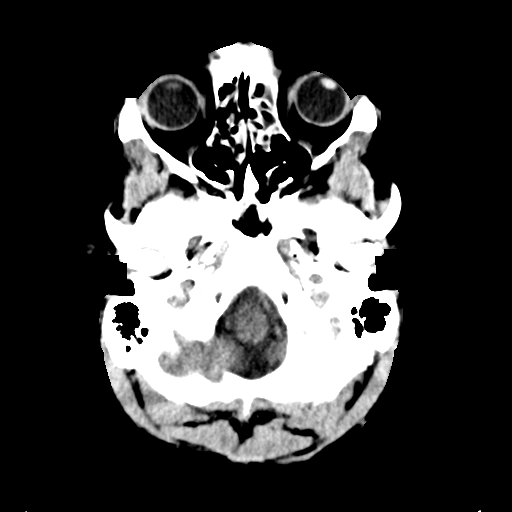
[im 3/33  bone]
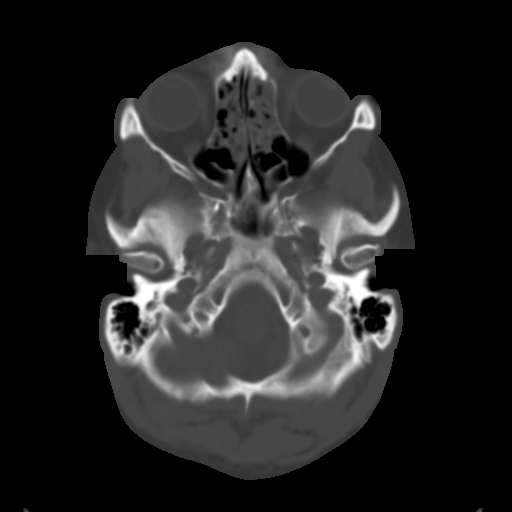
[im 6/33  brain]
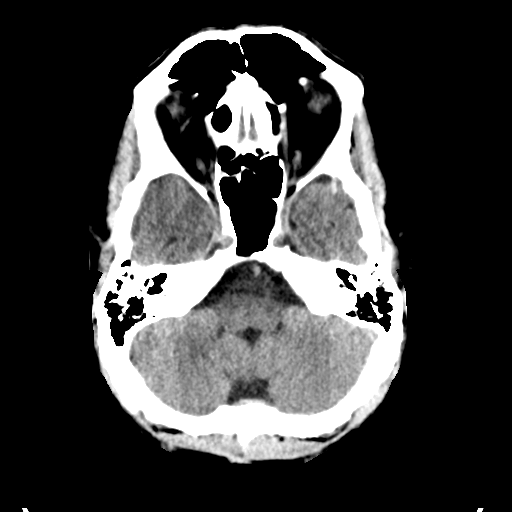
[im 9/33  brain]
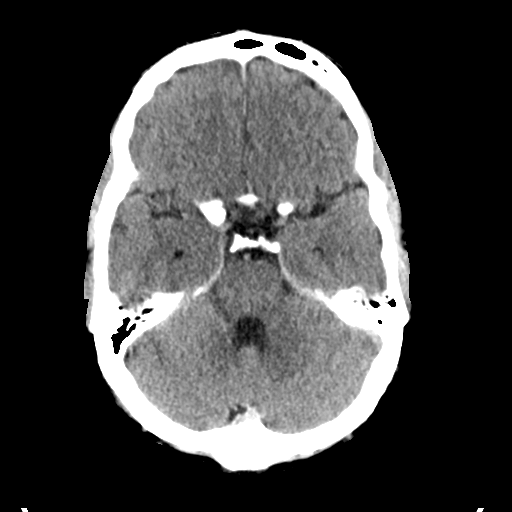
[im 12/33  brain]
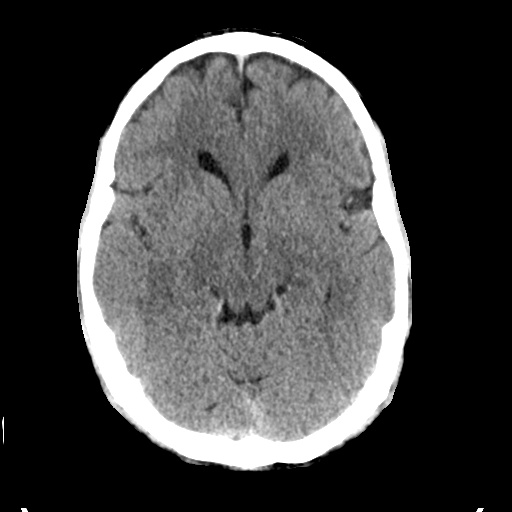
[im 15/33  brain]
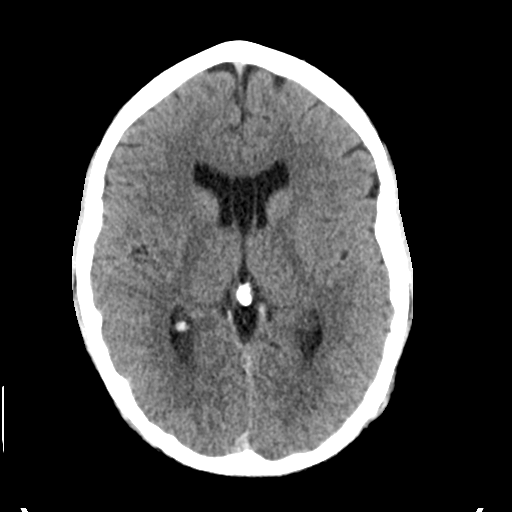
[im 15/33  bone]
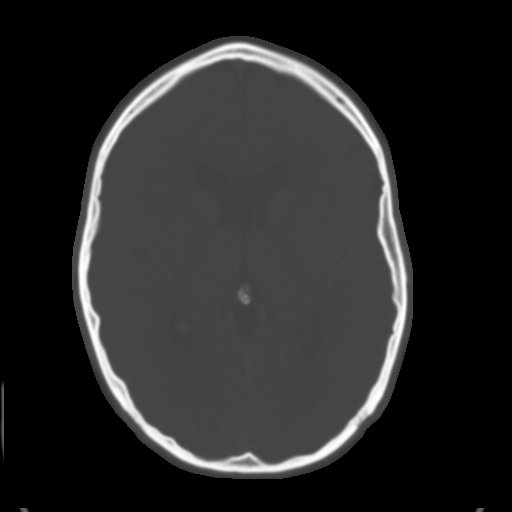
[im 18/33  brain]
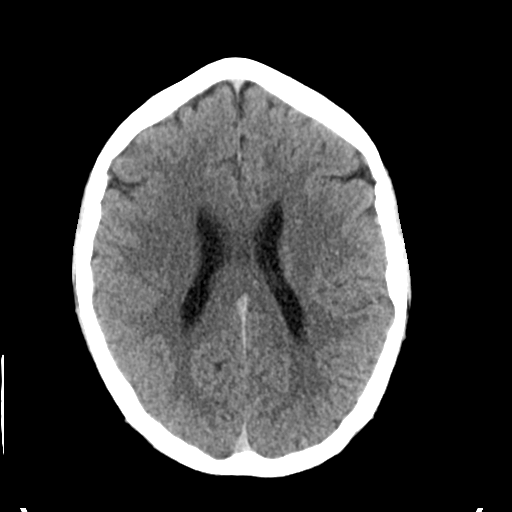
[im 21/33  brain]
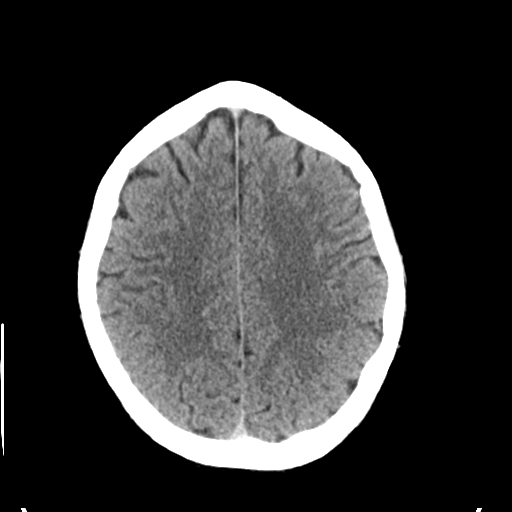
[im 25/33  brain]
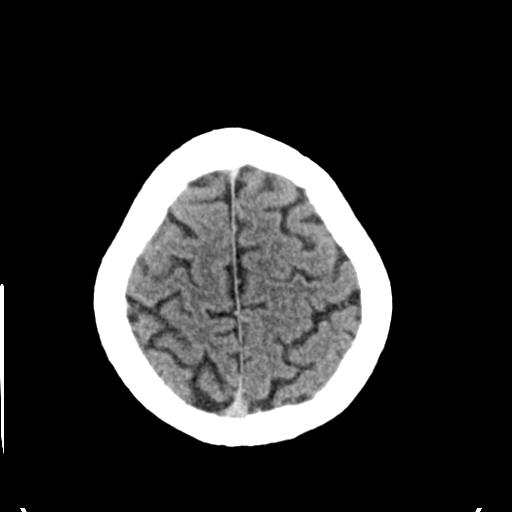
[im 27/33  brain]
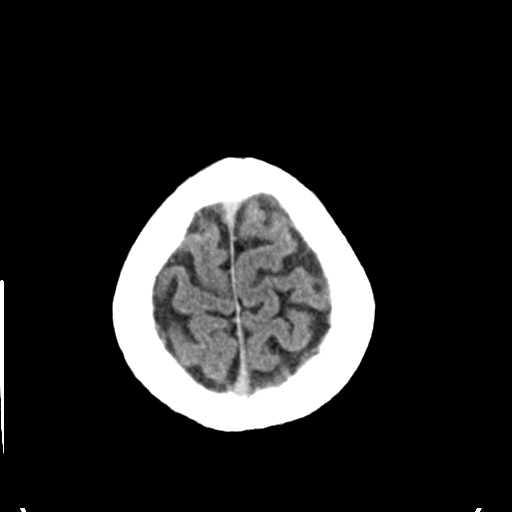
[im 27/33  bone]
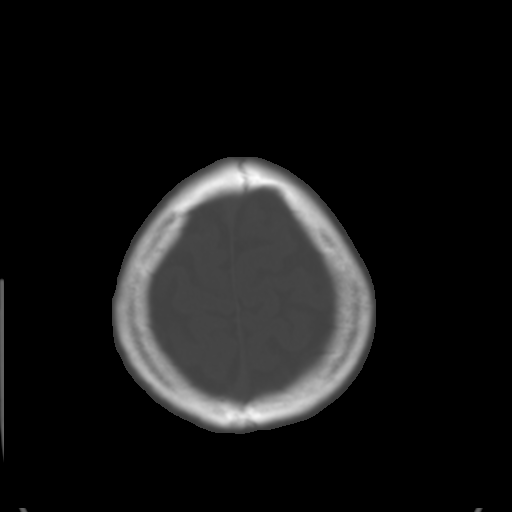
[im 30/33  brain]
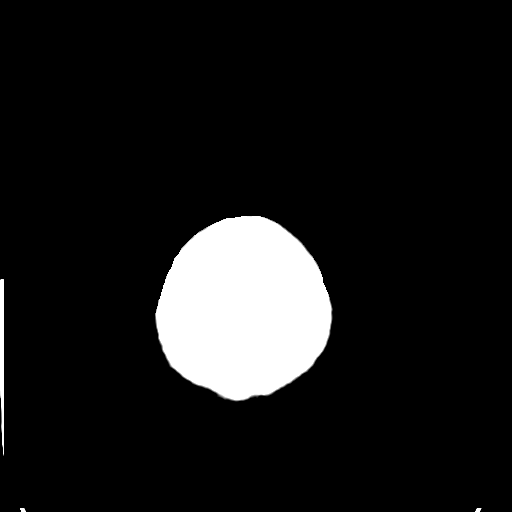

[Series 4: coronal soft tissue · coronal · 0.35mm/px · 3 of 72 slices shown]
[im 24/72  brain]
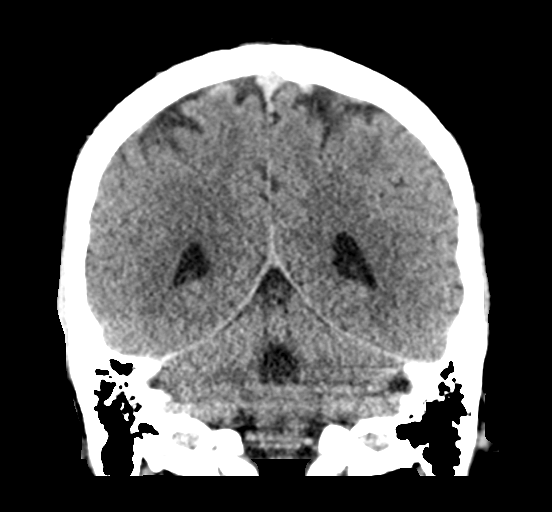
[im 32/72  brain]
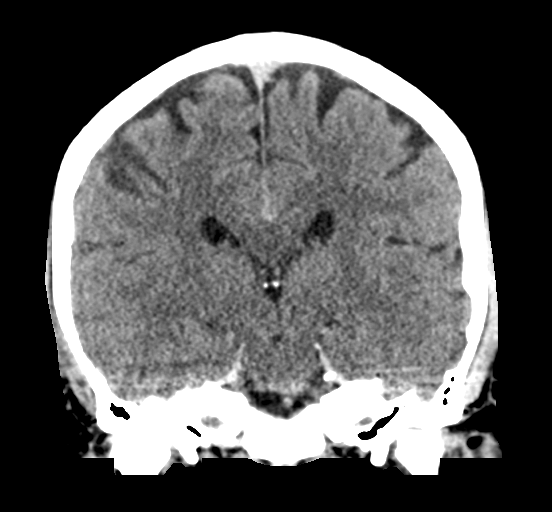
[im 40/72  brain]
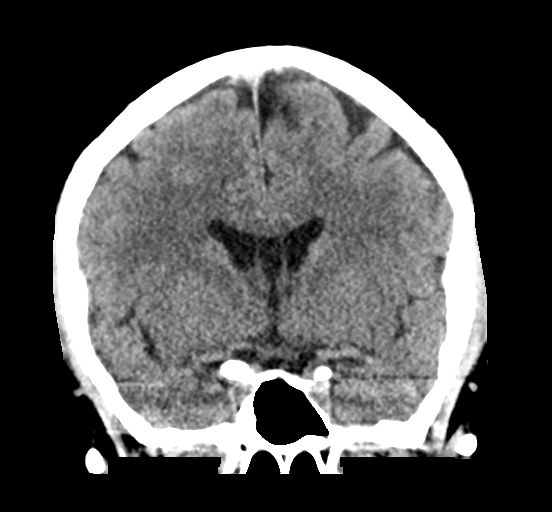

[Series 5: sagittal soft tissue · sagittal · 0.38mm/px · 3 of 63 slices shown]
[im 21/63  brain]
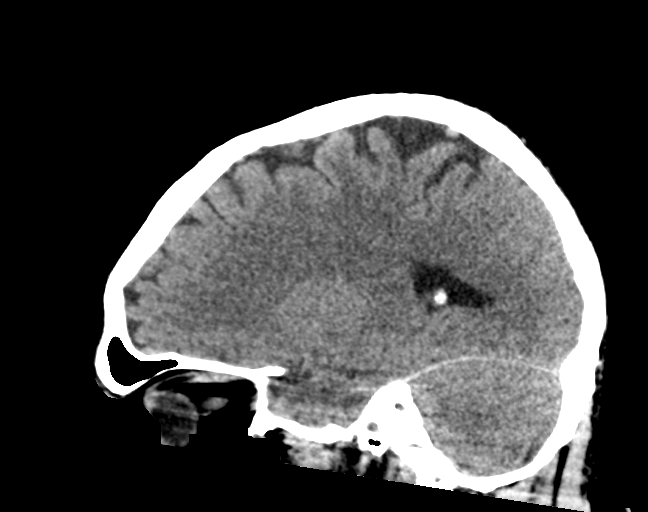
[im 32/63  brain]
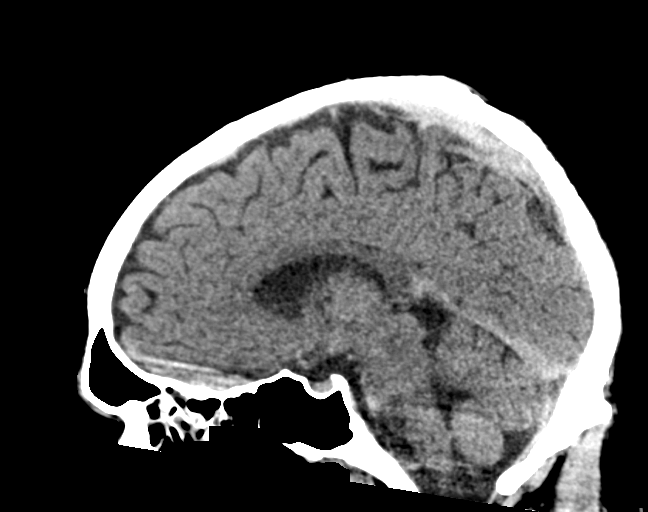
[im 42/63  brain]
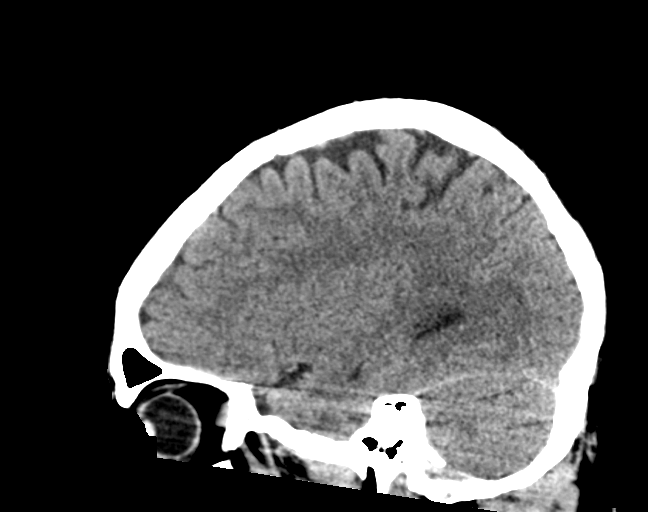

[16 of 47 positions shown; findings below may reference images not displayed]

FINDINGS: Brain: No evidence of acute infarction, hemorrhage, hydrocephalus,
extra-axial collection or mass lesion/mass effect. Cavum septum
pellucidum et vergae noted.

Vascular: No hyperdense vessel or unexpected calcification.

Skull: Intact.

Sinuses/Orbits: Scattered ethmoid air cell disease noted.

Other: None.
IMPRESSION: No acute abnormality.

Scattered ethmoid air cell disease.

## 2018-11-12 ENCOUNTER — Other Ambulatory Visit: Payer: Self-pay | Admitting: *Deleted

## 2018-11-12 DIAGNOSIS — Z20822 Contact with and (suspected) exposure to covid-19: Secondary | ICD-10-CM

## 2018-11-13 LAB — NOVEL CORONAVIRUS, NAA: SARS-CoV-2, NAA: DETECTED — AB

## 2019-04-08 DIAGNOSIS — T7840XA Allergy, unspecified, initial encounter: Secondary | ICD-10-CM | POA: Insufficient documentation

## 2019-04-08 DIAGNOSIS — J320 Chronic maxillary sinusitis: Secondary | ICD-10-CM | POA: Insufficient documentation

## 2019-06-03 ENCOUNTER — Other Ambulatory Visit: Payer: Self-pay | Admitting: Endocrinology

## 2019-06-03 DIAGNOSIS — R222 Localized swelling, mass and lump, trunk: Secondary | ICD-10-CM

## 2019-06-05 ENCOUNTER — Ambulatory Visit
Admission: RE | Admit: 2019-06-05 | Discharge: 2019-06-05 | Disposition: A | Discharge status: Home / Self Care | Payer: 59 | Source: Ambulatory Visit | Attending: Endocrinology | Admitting: Endocrinology

## 2019-06-05 ENCOUNTER — Other Ambulatory Visit: Payer: Self-pay | Admitting: Endocrinology

## 2019-06-05 DIAGNOSIS — R222 Localized swelling, mass and lump, trunk: Secondary | ICD-10-CM

## 2021-06-06 ENCOUNTER — Ambulatory Visit
Admission: RE | Admit: 2021-06-06 | Discharge: 2021-06-06 | Disposition: A | Discharge status: Home / Self Care | Payer: 59 | Source: Ambulatory Visit | Attending: Nurse Practitioner | Admitting: Nurse Practitioner

## 2021-06-06 VITALS — BP 152/80 | HR 77 | Temp 99.1°F | Resp 16

## 2021-06-06 DIAGNOSIS — N453 Epididymo-orchitis: Secondary | ICD-10-CM | POA: Diagnosis present

## 2021-06-06 LAB — POCT URINALYSIS DIP (MANUAL ENTRY)
Bilirubin, UA: NEGATIVE
Blood, UA: NEGATIVE
Glucose, UA: >=1000 mg/dL — AB
Ketones, POC UA: NEGATIVE mg/dL
Leukocytes, UA: NEGATIVE
Nitrite, UA: NEGATIVE
Protein Ur, POC: NEGATIVE mg/dL
Spec Grav, UA: 1.01 (ref 1.010–1.025)
Urobilinogen, UA: 2 E.U./dL — AB
pH, UA: 7 (ref 5.0–8.0)

## 2021-06-06 LAB — POCT FASTING CBG KUC MANUAL ENTRY: POCT Glucose (KUC): 280 mg/dL — AB (ref 70–99)

## 2021-06-06 MED ORDER — LEVOFLOXACIN 500 MG PO TABS: 500.0000 mg | ORAL_TABLET | Freq: Every day | ORAL | 0 refills | Status: AC

## 2021-06-06 NOTE — ED Provider Notes (Addendum)
[facility]    CSN: [id number] Arrival date & time: 06/06/21  1334      History   Chief Complaint Chief Complaint  Patient presents with   Testicle Pain    Fequent urinationLower abdomen pain Lower back pain - Entered by patient   Appointment    1400   Abdominal Pain    HPI John Cross is a 36 y.o. male.   The patient is a 36 year old male who presents with testicular pain.  Symptoms have been present for the past 2 to 3 days.  Over the past 24 hours, symptoms have worsened.  Patient complains of tenderness to the scrotal/testicular sac.  He states that prior to those symptoms starting, he had low back pain.  Patient states prior to the symptoms starting, he felt like he was developing an upper respiratory infection.  Today, he continues to have testicular pain.  He states that his wife states his testicles look swollen.  He denies penile discharge, dysuria, decreased urine stream, nausea, vomiting, or diarrhea.  Patient is a circumcised male. He does state that he has had urinary frequency.  He is married, and denies any concern for STI/STD.  Patient does have a history of type 1 diabetes, his current blood glucose reading on his CGM is 280.    The history is provided by the patient.  Testicle Pain Associated symptoms include abdominal pain.  Abdominal Pain  Past Medical History:  Diagnosis Date   Diabetes mellitus without complication [facility])     Patient Active Problem List   Diagnosis Date Noted   DKA (diabetic ketoacidoses) 07/29/2012    Past Surgical History:  Procedure Laterality Date   ORTHOPEDIC SURGERY     rod placed in right leg       Home Medications    Prior to Admission medications   Medication Sig Start Date End Date Taking? Authorizing Provider  levofloxacin (LEVAQUIN) 500 MG tablet Take 1 tablet (500 mg total) by mouth daily. 06/06/21  Yes Dezi Brauner-Warren, Alda Lea, NP  glucagon 1 MG injection Glucagon Emergency Kit 1 mg solution for  injection  USE AS DIRECTED    [provider]  HUMALOG 100 UNIT/ML injection INJECT UP TO 70 UNITS DAILY PER INSULIN PUMP 04/10/17   [provider]  HYDROcodone-acetaminophen (NORCO/VICODIN) 5-325 MG per tablet Take one-two tabs po q 4-6 hrs prn pain Patient not taking: Reported on 05/09/2017 05/18/13   Triplett, Tammy, PA-C  magnesium citrate SOLN magnesium citrate oral solution  TAKE 150 ML TWICE A DAY BY ORAL ROUTE AS NEEDED.    [provider]  ondansetron (ZOFRAN ODT) 4 MG disintegrating tablet 73m ODT q4 hours prn nausea/vomit 05/09/17   ZMilton Ferguson MD  traMADol (ULTRAM) 50 MG tablet Take 1 tablet (50 mg total) by mouth every 6 (six) hours as needed. 05/09/17   ZMilton Ferguson MD  TRUEPLUS INSULIN SYRINGE 28G X 1/2" 1 ML MISC INJECT 3X A DAY AS DIRECTED 12/28/20   [provider]    Family History History reviewed. No pertinent family history.  Social History Social History   Tobacco Use   Smoking status: Never   Smokeless tobacco: Never  Substance Use Topics   Alcohol use: No   Drug use: Never     Allergies   Patient has no known allergies.   Review of Systems Review of Systems  Gastrointestinal:  Positive for abdominal pain.  Genitourinary:  Positive for testicular pain.    Physical Exam Triage Vital  Signs ED Triage Vitals  Enc Vitals Group     BP 06/06/21 1406 (!) 152/80     Pulse Rate 06/06/21 1406 77     Resp 06/06/21 1406 16     Temp 06/06/21 1406 99.1 F (37.3 C)     Temp Source 06/06/21 1406 Oral     SpO2 06/06/21 1406 98 %     Weight --      Height --      Head Circumference --      Peak Flow --      Pain Score 06/06/21 1408 4     Pain Loc --      Pain Edu? --      Excl. in [place]? --    No data found.  Updated Vital Signs BP (!) 152/80 (BP Location: Right Arm)   Pulse 77   Temp 99.1 F (37.3 C) (Oral)   Resp 16   SpO2 98%   Visual Acuity Right Eye Distance:   Left Eye Distance:   Bilateral Distance:     Right Eye Near:   Left Eye Near:    Bilateral Near:     Physical Exam Vitals and nursing note reviewed. Exam conducted with a chaperone present Manuela Schwartz, RN).  Constitutional:      Appearance: He is well-developed.  HENT:     Head: Normocephalic.  Cardiovascular:     Rate and Rhythm: Normal rate and regular rhythm.     Heart sounds: Normal heart sounds.  Pulmonary:     Effort: Pulmonary effort is normal.     Breath sounds: Normal breath sounds.  Abdominal:     General: Bowel sounds are normal. There is no distension.     Palpations: Abdomen is soft.     Hernia: No hernia is present. There is no hernia in the left inguinal area or right inguinal area.  Genitourinary:    Penis: No tenderness or lesions.      Testes:        Right: Tenderness (right and left testicles) present. Mass not present. Right testis is descended.        Left: Tenderness present. Mass not present. Left testis is descended.     Epididymis:     Right: Tenderness present.     Left: Tenderness present.  Neurological:     Mental Status: He is alert.     UC Treatments / Results  Labs (all labs ordered are listed, but only abnormal results are displayed) Labs Reviewed  POCT URINALYSIS DIP (MANUAL ENTRY) - Abnormal; Notable for the following components:      Result Value   Glucose, UA >=1,000 (*)    Urobilinogen, UA 2.0 (*)    All other components within normal limits  POCT FASTING CBG [facility] MANUAL ENTRY - Abnormal; Notable for the following components:   POCT Glucose ([facility]) 280 (*)    All other components within normal limits  URINE CULTURE    EKG   Radiology No results found.  Procedures Procedures (including critical care time)  Medications Ordered in UC Medications - No data to display  Initial Impression / Assessment and Plan / UC Course  I have reviewed the triage vital signs and the nursing notes.  Pertinent labs & imaging results that were available during my care of the patient were  reviewed by me and considered in my medical decision making (see chart for details).  Patient is a 36 year old male who presents for testicular pain, lower abdominal pain, urinary  frequency.  His exam was reassuring to rule out testicular torsion.  He did have testicular/scrotal tenderness on exam.  There was no obvious swelling present.  We will treat patient for epididymitis today.  Patient was prescribed levofloxacin.  His urinalysis did not show any leukocytes or signs of infection at this time.  Does have glucose in his urine, which could be contributing to his urinary frequency.  Patient's glucose in the office today on his CGM was 280.  Discussion with the patient of when to go to the emergency room.  Patient advised that he should be feeling better within the next 24 to 48 hours, advised if he is not, he needs to go to the ER or sooner if symptoms worsen.  Patient verbalizes understanding, all questions were answered. Final Clinical Impressions(s) / UC Diagnoses   Final diagnoses:  Acute epididymo-orchitis     Discharge Instructions      Take medication as prescribed. Recommend over-the-counter ibuprofen as needed for pain or discomfort. Increase fluids. Continue to monitor for any worsening of your symptoms.  With this medication you are being prescribed today, your symptoms should improve within the next 24 to 48 hours.  If your symptoms worsen or fail to improve, you should go to the ER immediately.       ED Prescriptions     Medication Sig Dispense Auth. Provider   levofloxacin (LEVAQUIN) 500 MG tablet Take 1 tablet (500 mg total) by mouth daily. 10 tablet Toshika Parrow-Warren, Alda Lea, NP      PDMP not reviewed this encounter.   Tish Men, NP 06/06/21 1450    Eulala Newcombe-Warren, Alda Lea, NP 06/06/21 1450

## 2021-06-06 NOTE — ED Triage Notes (Signed)
Pt reports lower back pain x 2 days; testicular pina, chills,  lower abdominal, increase urinary frequency x 1 day. Pt reports he was taking cold meds as he thought he had a cold.

## 2021-06-06 NOTE — Discharge Instructions (Addendum)
Take medication as prescribed. Recommend over-the-counter ibuprofen as needed for pain or discomfort. Increase fluids. Continue to monitor for any worsening of your symptoms.  With this medication you are being prescribed today, your symptoms should improve within the next 24 to 48 hours.  If your symptoms worsen or fail to improve, you should go to the ER immediately.

## 2021-06-07 LAB — URINE CULTURE: Culture: NO GROWTH

## 2022-02-10 ENCOUNTER — Telehealth: Payer: 59 | Admitting: Nurse Practitioner

## 2022-02-10 DIAGNOSIS — B49 Unspecified mycosis: Secondary | ICD-10-CM | POA: Diagnosis not present

## 2022-02-10 MED ORDER — NYSTATIN 100000 UNIT/GM EX POWD: 1.0000 | Freq: Three times a day (TID) | CUTANEOUS | 0 refills | Status: AC

## 2022-02-10 MED ORDER — FLUCONAZOLE 150 MG PO TABS: ORAL_TABLET | ORAL | 0 refills | Status: AC

## 2022-02-10 NOTE — Progress Notes (Signed)
E Visit for Rash  We are sorry that you are not feeling well. Here is how we plan to help!   Based upon your presentation it appears you have a fungal infection.  I have prescribed:  Meds ordered this encounter  Medications   fluconazole (DIFLUCAN) 150 MG tablet    Sig: Take on days 1,4 and 7 (3 doses over the course of one week)    Dispense:  3 tablet    Refill:  0   nystatin (MYCOSTATIN/NYSTOP) powder    Sig: Apply 1 Application topically 3 (three) times daily.    Dispense:  15 g    Refill:  0    If you are using a scented Deoderant you need to change to a hypoallergenic Deoderant like Toms or Arm and Hammer to avoid ongoing irritation. It would also be helpful to take a daily allergy medication like Claritin 10mg  daily for the next week.       GET HELP RIGHT AWAY IF:  Symptoms don't go away after treatment. Severe itching that persists. If you rash spreads or swells. If you rash begins to smell. If it blisters and opens or develops a yellow-brown crust. You develop a fever. You have a sore throat. You become short of breath.  MAKE SURE YOU:  Understand these instructions. Will watch your condition. Will get help right away if you are not doing well or get worse.  Thank you for choosing an e-visit.  Your e-visit answers were reviewed by a board certified advanced clinical practitioner to complete your personal care plan. Depending upon the condition, your plan could have included both over the counter or prescription medications.  Please review your pharmacy choice. Make sure the pharmacy is open so you can pick up prescription now. If there is a problem, you may contact your provider through [employer] and have the prescription routed to another pharmacy.  Your safety is important to [place]. If you have drug allergies check your prescription carefully.   For the next 24 hours you can use MyChart to ask questions about today's visit, request a non-urgent call back,  or ask for a work or school excuse. You will get an email in the next two days asking about your experience. I hope that your e-visit has been valuable and will speed your recovery.   I spent approximately 5 minutes reviewing the patient's history, current symptoms and coordinating their care today.

## 2023-04-11 ENCOUNTER — Encounter

## 2023-04-20 ENCOUNTER — Ambulatory Visit: Payer: Self-pay

## 2023-04-20 ENCOUNTER — Telehealth: Admitting: Physician Assistant

## 2023-04-20 DIAGNOSIS — R051 Acute cough: Secondary | ICD-10-CM

## 2023-04-20 DIAGNOSIS — J019 Acute sinusitis, unspecified: Secondary | ICD-10-CM | POA: Diagnosis not present

## 2023-04-20 DIAGNOSIS — B9689 Other specified bacterial agents as the cause of diseases classified elsewhere: Secondary | ICD-10-CM

## 2023-04-20 MED ORDER — BENZONATATE 100 MG PO CAPS: 100.0000 mg | ORAL_CAPSULE | Freq: Three times a day (TID) | ORAL | 0 refills | Status: AC | PRN

## 2023-04-20 MED ORDER — AMOXICILLIN-POT CLAVULANATE 875-125 MG PO TABS: 1.0000 | ORAL_TABLET | Freq: Two times a day (BID) | ORAL | 0 refills | Status: AC

## 2023-04-20 NOTE — Progress Notes (Signed)

## 2024-02-20 ENCOUNTER — Other Ambulatory Visit (HOSPITAL_BASED_OUTPATIENT_CLINIC_OR_DEPARTMENT_OTHER): Payer: Self-pay | Admitting: Family Medicine

## 2024-02-20 DIAGNOSIS — Z8249 Family history of ischemic heart disease and other diseases of the circulatory system: Secondary | ICD-10-CM

## 2024-03-05 ENCOUNTER — Other Ambulatory Visit (HOSPITAL_BASED_OUTPATIENT_CLINIC_OR_DEPARTMENT_OTHER)
